# Patient Record
Sex: Male | Born: 1970 | Race: White | Hispanic: No | Marital: Married | Smoking: Former smoker
Health system: Southern US, Community
[De-identification: ages and names within clinical notes are randomized; demographics above are authoritative.]

---

## 2005-09-24 ENCOUNTER — Ambulatory Visit (HOSPITAL_COMMUNITY): Admission: RE | Admit: 2005-09-24 | Discharge: 2005-09-24 | Payer: Self-pay | Admitting: Orthopedic Surgery

## 2013-04-02 ENCOUNTER — Emergency Department (HOSPITAL_BASED_OUTPATIENT_CLINIC_OR_DEPARTMENT_OTHER)
Admission: EM | Admit: 2013-04-02 | Discharge: 2013-04-02 | Disposition: A | Discharge status: Home / Self Care | Payer: BC Managed Care – PPO | Attending: Emergency Medicine | Admitting: Emergency Medicine

## 2013-04-02 ENCOUNTER — Encounter (HOSPITAL_BASED_OUTPATIENT_CLINIC_OR_DEPARTMENT_OTHER): Payer: Self-pay | Admitting: Emergency Medicine

## 2013-04-02 ENCOUNTER — Emergency Department (HOSPITAL_BASED_OUTPATIENT_CLINIC_OR_DEPARTMENT_OTHER): Payer: BC Managed Care – PPO

## 2013-04-02 DIAGNOSIS — S99929A Unspecified injury of unspecified foot, initial encounter: Principal | ICD-10-CM

## 2013-04-02 DIAGNOSIS — W1809XA Striking against other object with subsequent fall, initial encounter: Secondary | ICD-10-CM | POA: Insufficient documentation

## 2013-04-02 DIAGNOSIS — Y9339 Activity, other involving climbing, rappelling and jumping off: Secondary | ICD-10-CM | POA: Insufficient documentation

## 2013-04-02 DIAGNOSIS — Y929 Unspecified place or not applicable: Secondary | ICD-10-CM | POA: Insufficient documentation

## 2013-04-02 DIAGNOSIS — S8991XA Unspecified injury of right lower leg, initial encounter: Secondary | ICD-10-CM

## 2013-04-02 DIAGNOSIS — S99919A Unspecified injury of unspecified ankle, initial encounter: Principal | ICD-10-CM

## 2013-04-02 DIAGNOSIS — Z79899 Other long term (current) drug therapy: Secondary | ICD-10-CM | POA: Insufficient documentation

## 2013-04-02 DIAGNOSIS — S8990XA Unspecified injury of unspecified lower leg, initial encounter: Secondary | ICD-10-CM | POA: Insufficient documentation

## 2013-04-02 NOTE — ED Notes (Signed)
MD at bedside. 

## 2013-04-02 NOTE — ED Provider Notes (Signed)
CSN: [id number]     Arrival date & time 04/02/13  2148 History  This chart was scribed for Richardean Canal, MD by Dorothey Baseman, ED Scribe. This patient was seen in room MH11/MH11 and the patient's care was started at 10:20 PM.    Chief Complaint  Patient presents with  . Leg Injury   The history is provided by the patient. No language interpreter was used.   HPI Comments: Matthew Calhoun is a 43 y.o. male who presents to the Emergency Department complaining of an injury to the right leg that he sustained earlier today after he reports that jumped onto a sled to land on his knees, but that the sled hit a bump, causing a lot of impact to the right knee. He denies hitting his head. Patient is complaining of a constant, sudden-onset pain to the right knee. Patient states that he has been ambulatory since the incident, but that the pain is exacerbated with walking/bearing weight. He reports some associated mild numbness and paresthesias to the area. He denies taking any medications at home to treat his symptoms. He denies history of prior injury or surgery to the knee. Patient has no other pertinent medical history.   History reviewed. No pertinent past medical history. History reviewed. No pertinent past surgical history. History reviewed. No pertinent family history. History  Substance Use Topics  . Smoking status: Never Smoker   . Smokeless tobacco: Not on file  . Alcohol Use: No    Review of Systems  Musculoskeletal: Positive for arthralgias.  Neurological: Positive for numbness.  All other systems reviewed and are negative.   Allergies  Review of patient's allergies indicates no known allergies.  Home Medications   Current Outpatient Rx  Name  Route  Sig  Dispense  Refill  . ALPRAZolam (XANAX) 0.25 MG tablet   Oral   Take 0.25 mg by mouth at bedtime as needed for anxiety.         Marland Kitchen ibuprofen (ADVIL,MOTRIN) 800 MG tablet   Oral   Take 800 mg by mouth every 8 (eight) hours as needed.        . loratadine (CLARITIN) 10 MG tablet   Oral   Take 10 mg by mouth daily.           Triage Vitals: BP 150/105  Pulse 70  Temp(Src) 98.3 F (36.8 C) (Oral)  Resp 18  Ht 5\' 10"  (1.778 m)  Wt 195 lb (88.451 kg)  BMI 27.98 kg/m2  SpO2 100%  Physical Exam  Nursing note and vitals reviewed. Constitutional: He is oriented to person, place, and time. He appears well-developed and well-nourished. No distress.  HENT:  Head: Normocephalic and atraumatic.  Eyes: Conjunctivae are normal.  Neck: Normal range of motion. Neck supple.  Pulmonary/Chest: Effort normal. No respiratory distress.  Abdominal: He exhibits no distension.  Musculoskeletal: Normal range of motion.       Right knee: He exhibits swelling. Tenderness found.  No midline spinal tenderness to palpation.   Full range of motion of the bilateral hips without pain. No tenderness to the right femur or tibia/fibula.   Right knee is swollen with tenderness to palpation over the medial aspect. ACL and PCL intact.   Neurological: He is alert and oriented to person, place, and time.  Skin: Skin is warm and dry.  Psychiatric: He has a normal mood and affect. His behavior is normal.    ED Course  Procedures (including critical care time)  DIAGNOSTIC STUDIES: Oxygen  Saturation is 100% on room air, normal by my interpretation.    COORDINATION OF CARE: 10:25 PM- Ordered an x-ray of the right knee. Will order ibuprofen to manage symptoms. Discussed treatment plan with patient at bedside and patient verbalized agreement.     Labs Review Labs Reviewed - No data to display  Imaging Review Dg Knee Complete 4 Views Right  04/02/2013   CLINICAL DATA:  Injury  EXAM: RIGHT KNEE - COMPLETE 4+ VIEW  COMPARISON:  None.  FINDINGS: There is no evidence of fracture, dislocation, or joint effusion. There is no evidence of arthropathy or other focal bone abnormality. Soft tissues are unremarkable.  IMPRESSION: Negative.    Electronically Signed   By: Rise MuBenjamin  McClintock M.D.   On: 04/02/2013 22:56    EKG Interpretation   None       MDM   Final diagnoses:  None   Matthew Calhoun is a 43 y.o. male here with R knee injury. He is bearing weight on it. Xray showed no obvious fracture. I doubt tibial plateau fracture. Will give crutches, ace wrap knee. Recommend ortho f/u if symptoms worsen.    I personally performed the services described in this documentation, which was scribed in my presence. The recorded information has been reviewed and is accurate.   Richardean Canalavid H Yao, MD 04/02/13 [phone]

## 2013-04-02 NOTE — Discharge Instructions (Signed)
Take motrin 800 mg every 6 hrs for pain.  Use crutches and ace wrap for comfort.   Follow up with Dr. Pearletha ForgeHudnall in a week if pain not improved.   Return to ER if you are unable to walk, severe pain.

## 2013-04-02 NOTE — ED Notes (Signed)
Pt jumped on sled sitting on his knees, hit a bump and when patient landed he landed mostly on his right knee, severe pain immediately and when ambulating

## 2013-04-02 NOTE — ED Notes (Signed)
Pt has ace wrap, knee sleeve, and crutches at home and prefers a knee immobilizer, MD Silverio LayYao informed.

## 2013-04-10 ENCOUNTER — Ambulatory Visit (INDEPENDENT_AMBULATORY_CARE_PROVIDER_SITE_OTHER): Payer: BC Managed Care – PPO | Admitting: Family Medicine

## 2013-04-10 ENCOUNTER — Encounter: Payer: Self-pay | Admitting: Family Medicine

## 2013-04-10 VITALS — BP 126/85 | HR 73 | Ht 70.0 in | Wt 199.0 lb

## 2013-04-10 DIAGNOSIS — S99929A Unspecified injury of unspecified foot, initial encounter: Secondary | ICD-10-CM

## 2013-04-10 DIAGNOSIS — S99919A Unspecified injury of unspecified ankle, initial encounter: Secondary | ICD-10-CM

## 2013-04-10 DIAGNOSIS — S8990XA Unspecified injury of unspecified lower leg, initial encounter: Secondary | ICD-10-CM

## 2013-04-10 DIAGNOSIS — S8991XA Unspecified injury of right lower leg, initial encounter: Secondary | ICD-10-CM

## 2013-04-10 NOTE — Patient Instructions (Signed)
You sprained your PCL. Ice knee 15 minutes at a time 3-4 times a day. Ibuprofen 600mg  three times a day or aleve 2 tabs twice a day as needed for pain, inflammation. Wear sleeve or ACE wrap for compression. Knee brace when up and walking around. Elevate above the level of your heart as much as possible. Straight leg raises, knee extensions, half-squats and half-lunges 3 sets of 10 once a day. Otherwise activities as tolerated. Follow up with me in 4 weeks for reevaluation.

## 2013-04-12 ENCOUNTER — Encounter: Payer: Self-pay | Admitting: Family Medicine

## 2013-04-12 NOTE — Progress Notes (Signed)
Patient ID: Matthew GladdenJason Comer, male   DOB: 1970/02/21, 43 y.o.   MRN: [id number]  PCP: Toniann FailHAWKS,AL N, MD  Subjective:   HPI: Patient is a 43 y.o. male here for right knee injury.  Patient reports on 2/26 he jumped onto a sled landing hard on his knees. Felt fairly severe pain in right knee and had to stop - allowed kids to continue sledding. By a couple hours later knee was swollen, went to ED. X-rays negative. Started with immobilizer and icing for a few days. Now using knee sleeve with brace. No prior injuries. A lot of improvement in pain and swelling since the injury. No catching, locking.  History reviewed. No pertinent past medical history.  Current Outpatient Prescriptions on File Prior to Visit  Medication Sig Dispense Refill  . ALPRAZolam (XANAX) 0.25 MG tablet Take 0.25 mg by mouth at bedtime as needed for anxiety.      Marland Kitchen. ibuprofen (ADVIL,MOTRIN) 800 MG tablet Take 800 mg by mouth every 8 (eight) hours as needed.      . loratadine (CLARITIN) 10 MG tablet Take 10 mg by mouth daily.       No current facility-administered medications on file prior to visit.    History reviewed. No pertinent past surgical history.  Allergies  Allergen Reactions  . Shellfish Allergy     History   Social History  . Marital Status: Married    Spouse Name: N/A    Number of Children: N/A  . Years of Education: N/A   Occupational History  . Not on file.   Social History Main Topics  . Smoking status: Former Smoker    Quit date: 02/06/2003  . Smokeless tobacco: Not on file  . Alcohol Use: No  . Drug Use: Not on file  . Sexual Activity: Not on file   Other Topics Concern  . Not on file   Social History Narrative  . No narrative on file    Family History  Problem Relation Age of Onset  . Heart attack Father   . Sudden death Father   . Diabetes Neg Hx   . Hyperlipidemia Neg Hx   . Hypertension Neg Hx     BP 126/85  Pulse 73  Ht 5\' 10"  (1.778 m)  Wt 199 lb (90.266 kg)  BMI  28.55 kg/m2  Review of Systems: See HPI above.    Objective:  Physical Exam:  Gen: NAD  R knee: Mod-large effusion.  No bruising, other deformity. No TTP joint lines, elsewhere about knee. ROM 0 - 110 degrees, limited by swelling. Negative ant/post drawers. Negative valgus/varus testing. Negative lachmanns. Negative mcmurrays, apleys, patellar apprehension. NV intact distally.    Assessment & Plan:  1. Right knee injury - given patient's mechanism of injury (direct blow) along with effusion and otherwise normal exam suspect he has a partial tear/sprain of his PCL.  Already improving quite a bit.  Will treat conservatively with icing, nsaids, knee brace, home exercise program.  Activities as tolerated.  F/u in 4 weeks for reevaluation.  Consider MRi if not improving.

## 2013-04-13 DIAGNOSIS — S8991XA Unspecified injury of right lower leg, initial encounter: Secondary | ICD-10-CM | POA: Insufficient documentation

## 2013-04-13 NOTE — Assessment & Plan Note (Signed)
given patient's mechanism of injury (direct blow) along with effusion and otherwise normal exam suspect he has a partial tear/sprain of his PCL.  Already improving quite a bit.  Will treat conservatively with icing, nsaids, knee brace, home exercise program.  Activities as tolerated.  F/u in 4 weeks for reevaluation.  Consider MRi if not improving.

## 2013-05-14 ENCOUNTER — Ambulatory Visit (INDEPENDENT_AMBULATORY_CARE_PROVIDER_SITE_OTHER): Payer: BC Managed Care – PPO | Admitting: Family Medicine

## 2013-05-14 ENCOUNTER — Encounter: Payer: Self-pay | Admitting: Family Medicine

## 2013-05-14 VITALS — BP 144/95 | HR 73 | Ht 70.0 in | Wt 190.0 lb

## 2013-05-14 DIAGNOSIS — S8990XA Unspecified injury of unspecified lower leg, initial encounter: Secondary | ICD-10-CM

## 2013-05-14 DIAGNOSIS — S99929A Unspecified injury of unspecified foot, initial encounter: Secondary | ICD-10-CM

## 2013-05-14 DIAGNOSIS — S8991XA Unspecified injury of right lower leg, initial encounter: Secondary | ICD-10-CM

## 2013-05-14 DIAGNOSIS — S99919A Unspecified injury of unspecified ankle, initial encounter: Secondary | ICD-10-CM

## 2013-05-14 NOTE — Patient Instructions (Signed)
You sprained your PCL. Ice knee 15 minutes at a time 3-4 times a day as needed. Ibuprofen 600mg  three times a day or aleve 2 tabs twice a day as needed for pain, inflammation. Wear sleeve or ACE wrap for compression. Elevate above the level of your heart as much as possible for swelling. Start physical therapy and do home exercises every day you don't go to therapy. Otherwise activities as tolerated. Follow up with me in 6 weeks. If you don't continue to improve would consider MRI.

## 2013-05-18 ENCOUNTER — Encounter: Payer: Self-pay | Admitting: Family Medicine

## 2013-05-18 NOTE — Progress Notes (Signed)
Patient ID: Matthew GladdenJason Calhoun, male   DOB: 07/04/1970, 43 y.o.   MRN: [id number]  PCP: Toniann FailHAWKS,AL N, MD  Subjective:   HPI: Patient is a 43 y.o. male here for right knee injury.  3/6: Patient reports on 2/26 he jumped onto a sled landing hard on his knees. Felt fairly severe pain in right knee and had to stop - allowed kids to continue sledding. By a couple hours later knee was swollen, went to ED. X-rays negative. Started with immobilizer and icing for a few days. Now using knee sleeve with brace. No prior injuries. A lot of improvement in pain and swelling since the injury. No catching, locking.  4/9: Patient reports he's had a lot of improvement since last visit and initial injury. Now about 2/10 level of pain. Still swollen. Difficulty running and climbing stairs. Wearing sleeve. Some use of motrin and icing. No locking, catching, giving out.  History reviewed. No pertinent past medical history.  Current Outpatient Prescriptions on File Prior to Visit  Medication Sig Dispense Refill  . ALPRAZolam (XANAX) 0.25 MG tablet Take 0.25 mg by mouth at bedtime as needed for anxiety.      Marland Kitchen. ibuprofen (ADVIL,MOTRIN) 800 MG tablet Take 800 mg by mouth every 8 (eight) hours as needed.      . loratadine (CLARITIN) 10 MG tablet Take 10 mg by mouth daily.       No current facility-administered medications on file prior to visit.    History reviewed. No pertinent past surgical history.  Allergies  Allergen Reactions  . Shellfish Allergy     History   Social History  . Marital Status: Married    Spouse Name: N/A    Number of Children: N/A  . Years of Education: N/A   Occupational History  . Not on file.   Social History Main Topics  . Smoking status: Former Smoker    Quit date: 02/06/2003  . Smokeless tobacco: Not on file  . Alcohol Use: No  . Drug Use: Not on file  . Sexual Activity: Not on file   Other Topics Concern  . Not on file   Social History Narrative  . No  narrative on file    Family History  Problem Relation Age of Onset  . Heart attack Father   . Sudden death Father   . Diabetes Neg Hx   . Hyperlipidemia Neg Hx   . Hypertension Neg Hx     BP 144/95  Pulse 73  Ht 5\' 10"  (1.778 m)  Wt 190 lb (86.183 kg)  BMI 27.26 kg/m2  Review of Systems: See HPI above.    Objective:  Physical Exam:  Gen: NAD  R knee: Mod effusion.  No bruising, other deformity. No TTP joint lines, elsewhere about knee. ROM 0 - 110 degrees, limited by swelling. Negative ant/post drawers. Negative valgus/varus testing. Negative lachmanns. Negative mcmurrays, apleys, patellar apprehension. NV intact distally.    Assessment & Plan:  1. Right knee injury - given patient's mechanism of injury (direct blow) along with effusion and otherwise normal exam suspect he has a partial tear/sprain of his PCL.  No instability, locking, catching.  Primary issue is the swelling I believe at this point.  Will try to treat conservatively without mechanical symptoms, instability.  Start physical therapy.  Icing, nsaids, sleeve, home exercise program.  Activities as tolerated.  F/u in 6 weeks for reevaluation.  Consider MRi if not improving.

## 2013-05-18 NOTE — Assessment & Plan Note (Signed)
given patient's mechanism of injury (direct blow) along with effusion and otherwise normal exam suspect he has a partial tear/sprain of his PCL.  No instability, locking, catching.  Primary issue is the swelling I believe at this point.  Will try to treat conservatively without mechanical symptoms, instability.  Start physical therapy.  Icing, nsaids, sleeve, home exercise program.  Activities as tolerated.  F/u in 6 weeks for reevaluation.  Consider MRi if not improving.

## 2013-06-25 ENCOUNTER — Telehealth: Payer: Self-pay | Admitting: Family Medicine

## 2013-06-25 ENCOUNTER — Ambulatory Visit: Payer: BC Managed Care – PPO | Admitting: Family Medicine

## 2013-06-25 NOTE — Telephone Encounter (Signed)
We will arrange.  Thanks!

## 2013-07-14 ENCOUNTER — Other Ambulatory Visit: Payer: Self-pay | Admitting: *Deleted

## 2013-07-14 DIAGNOSIS — M25561 Pain in right knee: Secondary | ICD-10-CM

## 2013-07-22 ENCOUNTER — Ambulatory Visit: Payer: BC Managed Care – PPO | Attending: Family Medicine | Admitting: Physical Therapy

## 2013-07-22 DIAGNOSIS — M25569 Pain in unspecified knee: Secondary | ICD-10-CM | POA: Diagnosis not present

## 2013-07-22 DIAGNOSIS — IMO0001 Reserved for inherently not codable concepts without codable children: Secondary | ICD-10-CM | POA: Insufficient documentation

## 2013-07-22 DIAGNOSIS — R262 Difficulty in walking, not elsewhere classified: Secondary | ICD-10-CM | POA: Insufficient documentation

## 2013-07-22 DIAGNOSIS — R609 Edema, unspecified: Secondary | ICD-10-CM | POA: Insufficient documentation

## 2013-07-29 ENCOUNTER — Ambulatory Visit: Payer: BC Managed Care – PPO | Admitting: Rehabilitation

## 2013-07-29 DIAGNOSIS — IMO0001 Reserved for inherently not codable concepts without codable children: Secondary | ICD-10-CM | POA: Diagnosis not present

## 2013-08-05 ENCOUNTER — Ambulatory Visit: Payer: BC Managed Care – PPO | Admitting: Rehabilitation

## 2013-08-10 ENCOUNTER — Ambulatory Visit: Payer: BC Managed Care – PPO | Attending: Family Medicine | Admitting: Rehabilitation

## 2013-08-10 DIAGNOSIS — R262 Difficulty in walking, not elsewhere classified: Secondary | ICD-10-CM | POA: Diagnosis not present

## 2013-08-10 DIAGNOSIS — R609 Edema, unspecified: Secondary | ICD-10-CM | POA: Diagnosis not present

## 2013-08-10 DIAGNOSIS — M25569 Pain in unspecified knee: Secondary | ICD-10-CM | POA: Diagnosis not present

## 2013-08-10 DIAGNOSIS — IMO0001 Reserved for inherently not codable concepts without codable children: Secondary | ICD-10-CM | POA: Insufficient documentation

## 2013-08-13 ENCOUNTER — Ambulatory Visit: Payer: BC Managed Care – PPO | Admitting: Rehabilitation

## 2013-08-13 DIAGNOSIS — IMO0001 Reserved for inherently not codable concepts without codable children: Secondary | ICD-10-CM | POA: Diagnosis not present

## 2013-08-17 ENCOUNTER — Ambulatory Visit: Payer: BC Managed Care – PPO | Admitting: Rehabilitation

## 2013-08-17 DIAGNOSIS — IMO0001 Reserved for inherently not codable concepts without codable children: Secondary | ICD-10-CM | POA: Diagnosis not present

## 2013-08-20 ENCOUNTER — Ambulatory Visit: Payer: BC Managed Care – PPO | Admitting: Physical Therapy

## 2013-08-20 DIAGNOSIS — IMO0001 Reserved for inherently not codable concepts without codable children: Secondary | ICD-10-CM | POA: Diagnosis not present

## 2013-08-24 ENCOUNTER — Ambulatory Visit: Payer: BC Managed Care – PPO | Admitting: Rehabilitation

## 2013-08-24 DIAGNOSIS — IMO0001 Reserved for inherently not codable concepts without codable children: Secondary | ICD-10-CM | POA: Diagnosis not present

## 2013-08-27 ENCOUNTER — Ambulatory Visit: Payer: BC Managed Care – PPO | Admitting: Physical Therapy

## 2013-08-27 DIAGNOSIS — IMO0001 Reserved for inherently not codable concepts without codable children: Secondary | ICD-10-CM | POA: Diagnosis not present

## 2013-08-31 ENCOUNTER — Ambulatory Visit: Payer: BC Managed Care – PPO | Admitting: Rehabilitation

## 2013-08-31 DIAGNOSIS — IMO0001 Reserved for inherently not codable concepts without codable children: Secondary | ICD-10-CM | POA: Diagnosis not present

## 2013-09-03 ENCOUNTER — Ambulatory Visit: Payer: BC Managed Care – PPO | Admitting: Physical Therapy

## 2013-09-07 ENCOUNTER — Ambulatory Visit: Payer: BC Managed Care – PPO | Attending: Family Medicine | Admitting: Physical Therapy

## 2013-09-07 DIAGNOSIS — IMO0001 Reserved for inherently not codable concepts without codable children: Secondary | ICD-10-CM | POA: Diagnosis present

## 2013-09-07 DIAGNOSIS — R262 Difficulty in walking, not elsewhere classified: Secondary | ICD-10-CM | POA: Insufficient documentation

## 2013-09-07 DIAGNOSIS — M25569 Pain in unspecified knee: Secondary | ICD-10-CM | POA: Insufficient documentation

## 2013-09-07 DIAGNOSIS — R609 Edema, unspecified: Secondary | ICD-10-CM | POA: Diagnosis not present

## 2013-09-09 ENCOUNTER — Ambulatory Visit: Payer: BC Managed Care – PPO | Admitting: Rehabilitation

## 2013-09-09 DIAGNOSIS — IMO0001 Reserved for inherently not codable concepts without codable children: Secondary | ICD-10-CM | POA: Diagnosis not present

## 2013-09-14 ENCOUNTER — Encounter: Payer: Self-pay | Admitting: Family Medicine

## 2013-09-14 ENCOUNTER — Ambulatory Visit (INDEPENDENT_AMBULATORY_CARE_PROVIDER_SITE_OTHER): Payer: BC Managed Care – PPO | Admitting: Family Medicine

## 2013-09-14 ENCOUNTER — Ambulatory Visit: Payer: BC Managed Care – PPO | Admitting: Rehabilitation

## 2013-09-14 VITALS — BP 140/100 | HR 74 | Ht 70.0 in | Wt 200.0 lb

## 2013-09-14 DIAGNOSIS — S8990XA Unspecified injury of unspecified lower leg, initial encounter: Secondary | ICD-10-CM

## 2013-09-14 DIAGNOSIS — IMO0001 Reserved for inherently not codable concepts without codable children: Secondary | ICD-10-CM | POA: Diagnosis not present

## 2013-09-14 DIAGNOSIS — S99919A Unspecified injury of unspecified ankle, initial encounter: Secondary | ICD-10-CM

## 2013-09-14 DIAGNOSIS — S8991XA Unspecified injury of right lower leg, initial encounter: Secondary | ICD-10-CM

## 2013-09-14 DIAGNOSIS — S99929A Unspecified injury of unspecified foot, initial encounter: Secondary | ICD-10-CM

## 2013-09-14 NOTE — Patient Instructions (Signed)
I will call you the business day following your MRI to go over results. Continue physical therapy in the meantime.

## 2013-09-15 ENCOUNTER — Other Ambulatory Visit: Payer: Self-pay | Admitting: Family Medicine

## 2013-09-15 DIAGNOSIS — Z1389 Encounter for screening for other disorder: Secondary | ICD-10-CM

## 2013-09-16 ENCOUNTER — Ambulatory Visit: Payer: BC Managed Care – PPO | Admitting: Physical Therapy

## 2013-09-16 ENCOUNTER — Ambulatory Visit (HOSPITAL_BASED_OUTPATIENT_CLINIC_OR_DEPARTMENT_OTHER)
Admission: RE | Admit: 2013-09-16 | Discharge: 2013-09-16 | Disposition: A | Discharge status: Home / Self Care | Payer: BC Managed Care – PPO | Source: Ambulatory Visit | Attending: Family Medicine | Admitting: Family Medicine

## 2013-09-16 ENCOUNTER — Encounter: Payer: Self-pay | Admitting: Family Medicine

## 2013-09-16 DIAGNOSIS — X58XXXA Exposure to other specified factors, initial encounter: Secondary | ICD-10-CM | POA: Diagnosis not present

## 2013-09-16 DIAGNOSIS — S8991XA Unspecified injury of right lower leg, initial encounter: Secondary | ICD-10-CM

## 2013-09-16 DIAGNOSIS — S8990XA Unspecified injury of unspecified lower leg, initial encounter: Secondary | ICD-10-CM | POA: Insufficient documentation

## 2013-09-16 DIAGNOSIS — IMO0001 Reserved for inherently not codable concepts without codable children: Secondary | ICD-10-CM | POA: Diagnosis not present

## 2013-09-16 DIAGNOSIS — R609 Edema, unspecified: Secondary | ICD-10-CM | POA: Insufficient documentation

## 2013-09-16 DIAGNOSIS — S99919A Unspecified injury of unspecified ankle, initial encounter: Secondary | ICD-10-CM | POA: Diagnosis present

## 2013-09-16 DIAGNOSIS — S99929A Unspecified injury of unspecified foot, initial encounter: Principal | ICD-10-CM

## 2013-09-16 DIAGNOSIS — Z1389 Encounter for screening for other disorder: Secondary | ICD-10-CM

## 2013-09-16 NOTE — Progress Notes (Addendum)
Patient ID: Matthew GladdenJason Calhoun, male   DOB: [birth year], 43 y.o.   MRN: [id number]  PCP: Matthew FailHAWKS,AL N, MD  Subjective:   HPI: Patient is a 43 y.o. male here for right knee injury.  3/6: Patient reports on 2/26 he jumped onto a sled landing hard on his knees. Felt fairly severe pain in right knee and had to stop - allowed kids to continue sledding. By a couple hours later knee was swollen, went to ED. X-rays negative. Started with immobilizer and icing for a few days. Now using knee sleeve with brace. No prior injuries. A lot of improvement in pain and swelling since the injury. No catching, locking.  4/9: Patient reports he's had a lot of improvement since last visit and initial injury. Now about 2/10 level of pain. Still swollen. Difficulty running and climbing stairs. Wearing sleeve. Some use of motrin and icing. No locking, catching, giving out.  8/10: Patient reports he's been doing physical therapy and has improved some but feels like this has plateaued. Difficulty with stairs, cycling. Worse when kneeling also. No catching, locking, giving out.  History reviewed. No pertinent past medical history.  Current Outpatient Prescriptions on File Prior to Visit  Medication Sig Dispense Refill  . ALPRAZolam (XANAX) 0.25 MG tablet Take 0.25 mg by mouth at bedtime as needed for anxiety.      Marland Kitchen. ibuprofen (ADVIL,MOTRIN) 800 MG tablet Take 800 mg by mouth every 8 (eight) hours as needed.      . loratadine (CLARITIN) 10 MG tablet Take 10 mg by mouth daily.       No current facility-administered medications on file prior to visit.    History reviewed. No pertinent past surgical history.  Allergies  Allergen Reactions  . Shellfish Allergy     History   Social History  . Marital Status: Married    Spouse Name: Calhoun/A    Number of Children: Calhoun/A  . Years of Education: Calhoun/A   Occupational History  . Not on file.   Social History Main Topics  . Smoking status: Former Smoker    Quit  date: 02/06/2003  . Smokeless tobacco: Not on file  . Alcohol Use: No  . Drug Use: Not on file  . Sexual Activity: Not on file   Other Topics Concern  . Not on file   Social History Narrative  . No narrative on file    Family History  Problem Relation Age of Onset  . Heart attack Father   . Sudden death Father   . Diabetes Neg Hx   . Hyperlipidemia Neg Hx   . Hypertension Neg Hx     BP 140/100  Pulse 74  Ht 5\' 10"  (1.778 m)  Wt 200 lb (90.719 kg)  BMI 28.70 kg/m2  Review of Systems: See HPI above.    Objective:  Physical Exam:  Gen: NAD  R knee: Mild effusion.  No bruising, other deformity. No TTP joint lines, elsewhere about knee. ROM 0 - 120 degrees, limited by swelling. Negative ant/post drawers. Negative valgus/varus testing. Negative lachmanns. Negative mcmurrays, apleys, patellar apprehension. NV intact distally.    Assessment & Plan:  1. Right knee injury - given patient's mechanism of injury (direct blow) along with effusion and otherwise normal exam suspect he has a partial tear/sprain of his PCL.  Improvement has plateaued with physical therapy.  Will move forward with MRI to further assess.  Addendum:  MRI reviewed and discussed with patient.  He does have a partial avulsion of  the vastus lateralis though we discussed treatment for this would be physical therapy as he is doing (can consider nitro patches as a trial as well).  Also has some chondral thinning and a small posterolateral femoral condyle OCD (7mm).  This is quite small and not on a part of the condyle that gets much weight bearing.  Advised him to continue with physical therapy.  Would consider intraarticular injection if not improving.  If still not improving he could consult with surgery though am not certain microfracture of a lesion this size would provide him much benefit.

## 2013-09-16 NOTE — Assessment & Plan Note (Signed)
given patient's mechanism of injury (direct blow) along with effusion and otherwise normal exam suspect he has a partial tear/sprain of his PCL.  Improvement has plateaued with physical therapy.  Will move forward with MRI to further assess.

## 2013-09-21 ENCOUNTER — Ambulatory Visit: Payer: BC Managed Care – PPO | Admitting: Rehabilitation

## 2013-09-23 ENCOUNTER — Ambulatory Visit: Payer: BC Managed Care – PPO | Admitting: Physical Therapy

## 2013-09-23 DIAGNOSIS — IMO0001 Reserved for inherently not codable concepts without codable children: Secondary | ICD-10-CM | POA: Diagnosis not present

## 2013-09-28 ENCOUNTER — Ambulatory Visit: Payer: BC Managed Care – PPO | Admitting: Rehabilitation

## 2013-09-30 ENCOUNTER — Ambulatory Visit: Payer: BC Managed Care – PPO | Admitting: Physical Therapy

## 2013-10-05 ENCOUNTER — Ambulatory Visit: Payer: BC Managed Care – PPO | Admitting: Rehabilitation

## 2013-10-07 ENCOUNTER — Ambulatory Visit: Payer: BC Managed Care – PPO | Admitting: Physical Therapy

## 2013-10-21 ENCOUNTER — Ambulatory Visit: Payer: BC Managed Care – PPO | Attending: Family Medicine | Admitting: Physical Therapy

## 2013-10-21 DIAGNOSIS — M25569 Pain in unspecified knee: Secondary | ICD-10-CM | POA: Diagnosis not present

## 2013-10-21 DIAGNOSIS — R262 Difficulty in walking, not elsewhere classified: Secondary | ICD-10-CM | POA: Diagnosis not present

## 2013-10-21 DIAGNOSIS — R609 Edema, unspecified: Secondary | ICD-10-CM | POA: Insufficient documentation

## 2013-10-21 DIAGNOSIS — IMO0001 Reserved for inherently not codable concepts without codable children: Secondary | ICD-10-CM | POA: Insufficient documentation

## 2013-10-26 ENCOUNTER — Ambulatory Visit: Payer: BC Managed Care – PPO | Admitting: Physical Therapy

## 2013-10-26 DIAGNOSIS — IMO0001 Reserved for inherently not codable concepts without codable children: Secondary | ICD-10-CM | POA: Diagnosis not present

## 2013-10-29 ENCOUNTER — Ambulatory Visit: Payer: BC Managed Care – PPO | Admitting: Physical Therapy

## 2013-10-29 DIAGNOSIS — IMO0001 Reserved for inherently not codable concepts without codable children: Secondary | ICD-10-CM | POA: Diagnosis not present

## 2013-11-02 ENCOUNTER — Ambulatory Visit: Payer: BC Managed Care – PPO | Admitting: Rehabilitation

## 2013-11-05 ENCOUNTER — Ambulatory Visit: Payer: BC Managed Care – PPO | Admitting: Physical Therapy

## 2013-11-17 ENCOUNTER — Ambulatory Visit: Payer: BC Managed Care – PPO | Admitting: Physical Therapy

## 2013-11-20 ENCOUNTER — Other Ambulatory Visit: Payer: Self-pay

## 2013-11-23 ENCOUNTER — Ambulatory Visit: Payer: BC Managed Care – PPO | Admitting: Physical Therapy

## 2013-11-25 ENCOUNTER — Ambulatory Visit: Payer: BC Managed Care – PPO | Admitting: Rehabilitation

## 2013-11-30 ENCOUNTER — Ambulatory Visit: Payer: BC Managed Care – PPO | Admitting: Physical Therapy

## 2013-12-02 ENCOUNTER — Ambulatory Visit: Payer: BC Managed Care – PPO | Admitting: Physical Therapy

## 2013-12-07 ENCOUNTER — Ambulatory Visit: Payer: BC Managed Care – PPO | Admitting: Physical Therapy

## 2013-12-09 ENCOUNTER — Ambulatory Visit: Payer: BC Managed Care – PPO | Admitting: Rehabilitation

## 2015-09-29 IMAGING — CR DG KNEE COMPLETE 4+V*R*
4 series · 4 of 4 positions shown · non-contrast
Comparison: None.

CLINICAL DATA: Injury

EXAM:
RIGHT KNEE - COMPLETE 4+ VIEW

[t knee ap right]
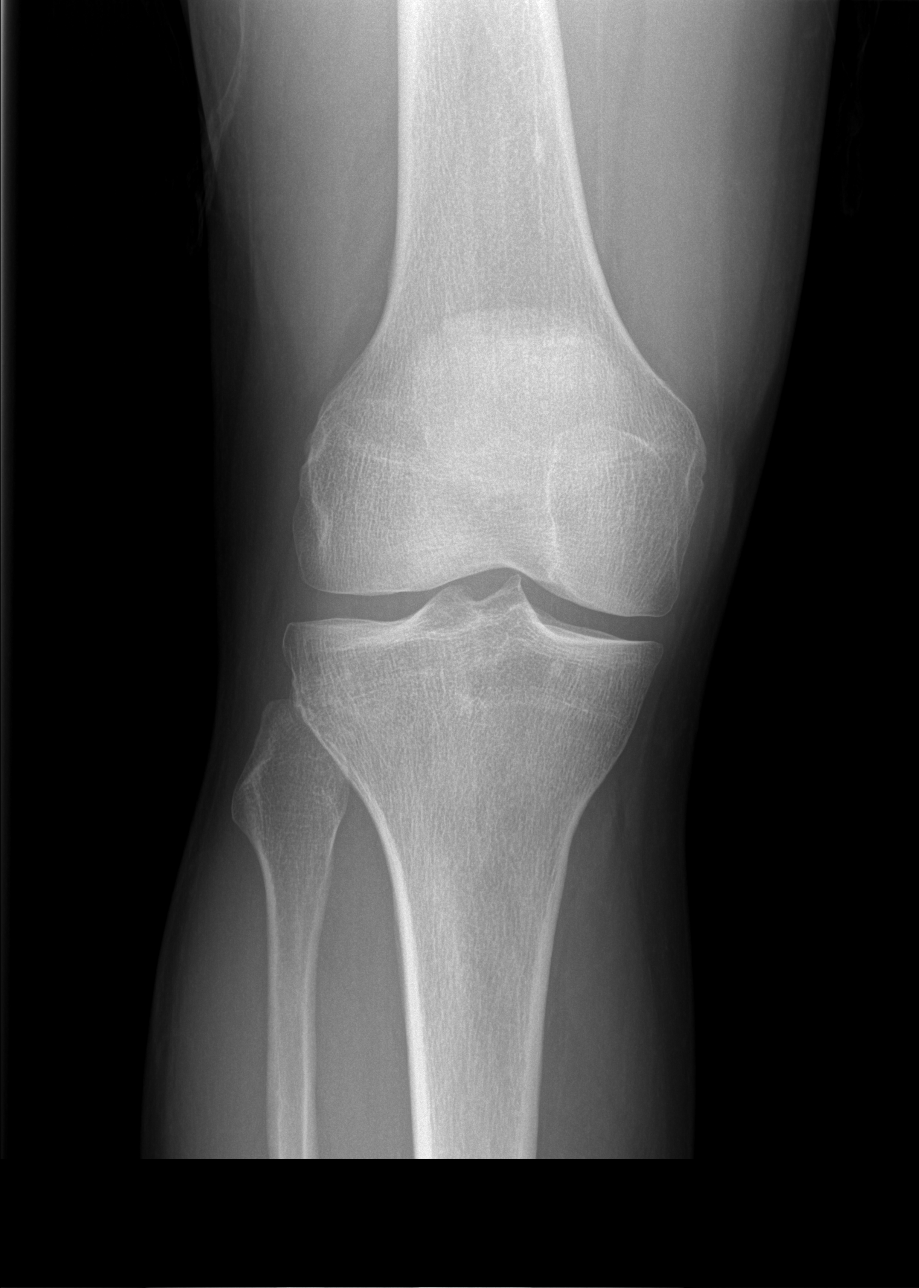

[t knee oblique right (1 of 2)]
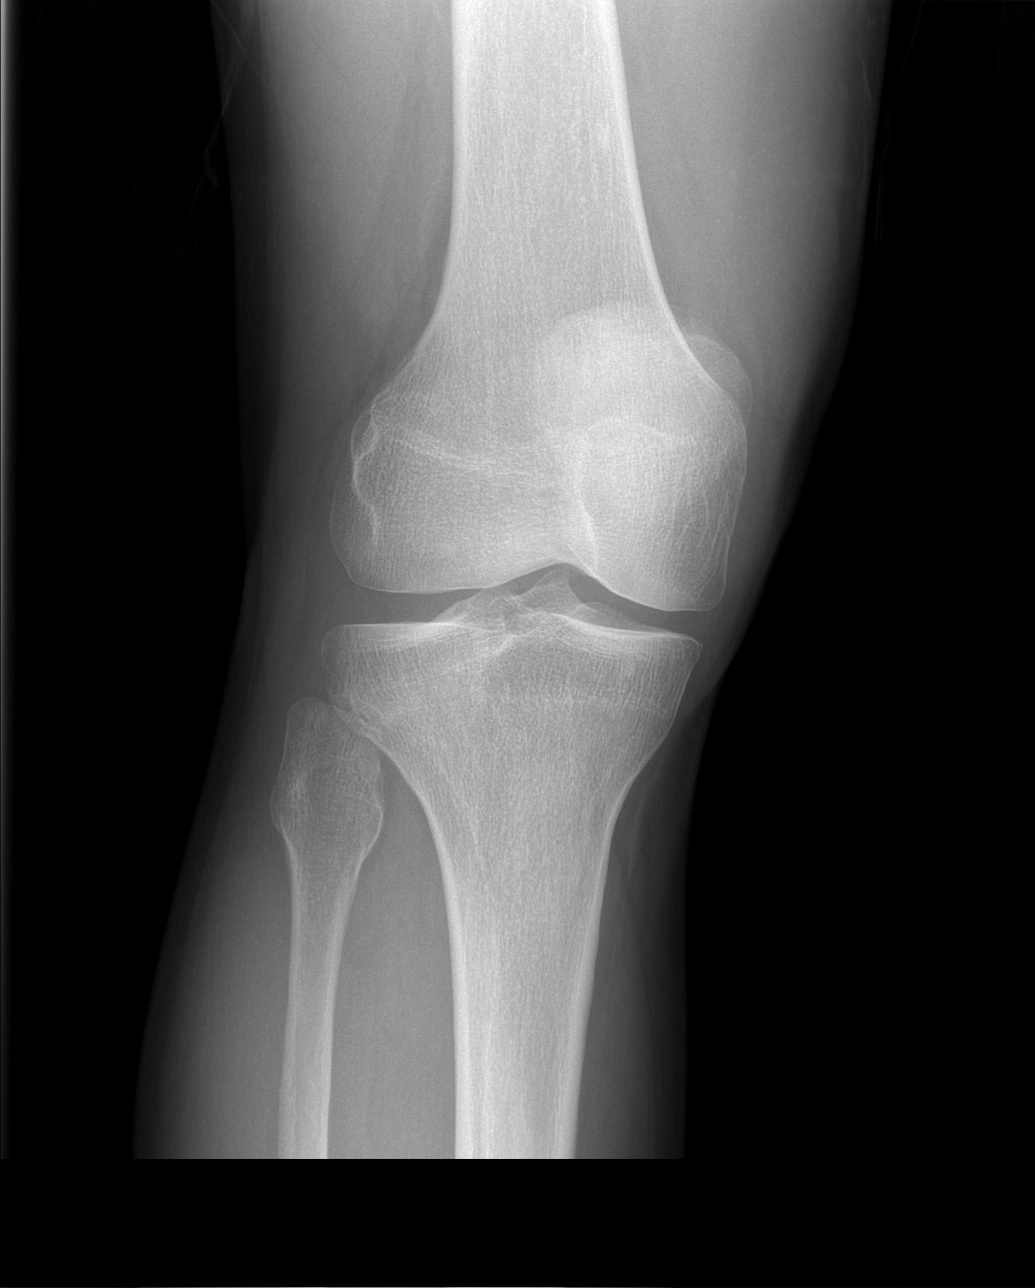

[t knee oblique right (2 of 2)]
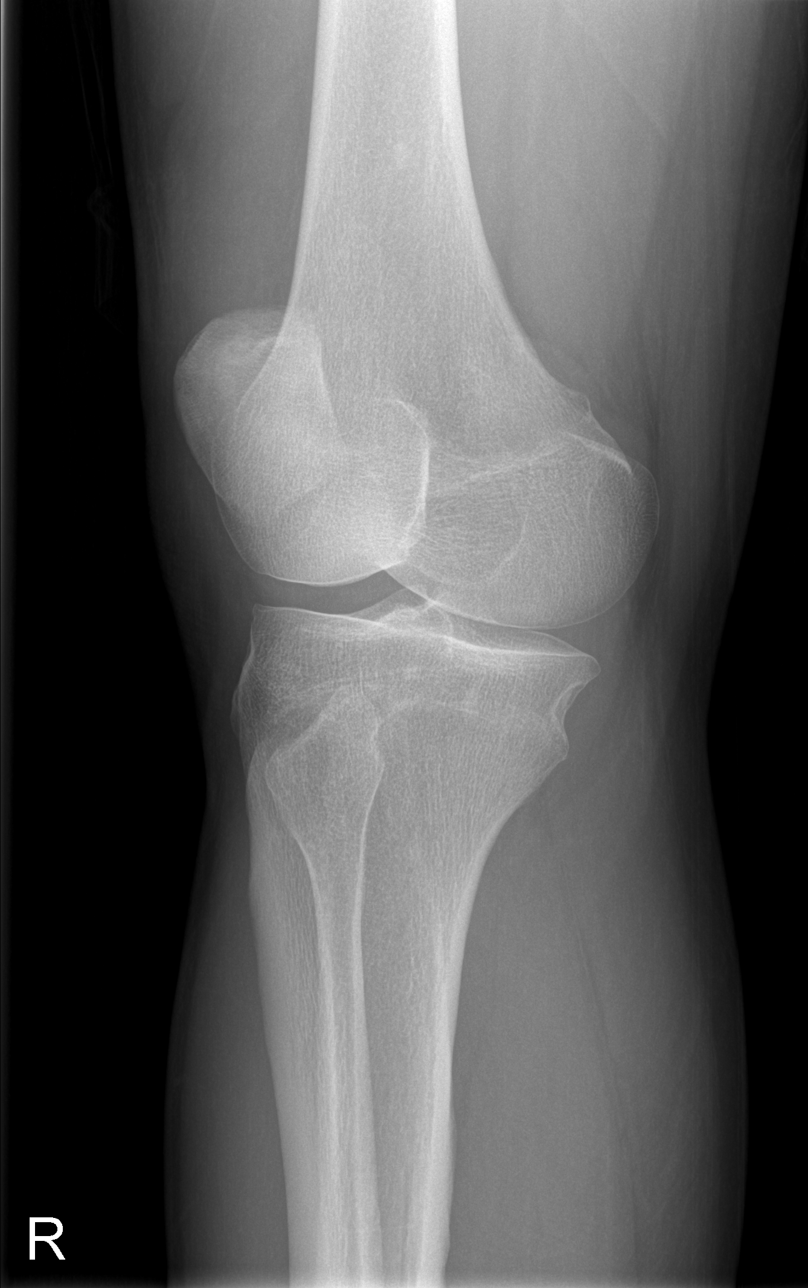

[t knee lat right]
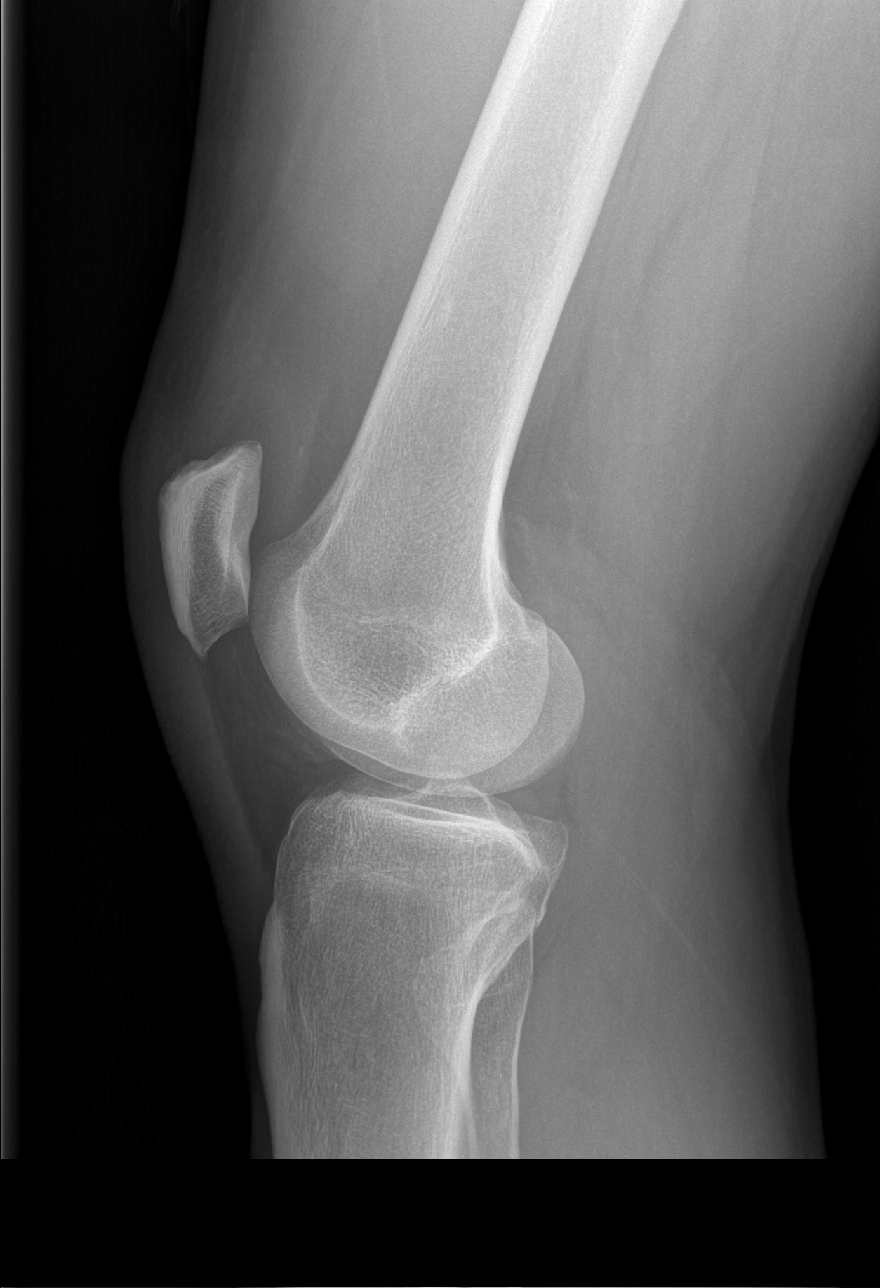

[4 of 4 positions shown; findings below may reference images not displayed]

FINDINGS: There is no evidence of fracture, dislocation, or joint effusion.
There is no evidence of arthropathy or other focal bone abnormality.
Soft tissues are unremarkable.
IMPRESSION: Negative.

## 2016-12-22 ENCOUNTER — Emergency Department (HOSPITAL_COMMUNITY)
Admission: EM | Admit: 2016-12-22 | Discharge: 2016-12-22 | Disposition: A | Discharge status: Home / Self Care | Payer: BLUE CROSS/BLUE SHIELD | Attending: Emergency Medicine | Admitting: Emergency Medicine

## 2016-12-22 ENCOUNTER — Emergency Department (HOSPITAL_BASED_OUTPATIENT_CLINIC_OR_DEPARTMENT_OTHER)
Admission: EM | Admit: 2016-12-22 | Discharge: 2016-12-22 | Disposition: A | Discharge status: Home / Self Care | Payer: BLUE CROSS/BLUE SHIELD | Source: Home / Self Care

## 2016-12-22 ENCOUNTER — Other Ambulatory Visit: Payer: Self-pay

## 2016-12-22 ENCOUNTER — Encounter (HOSPITAL_COMMUNITY): Payer: Self-pay

## 2016-12-22 ENCOUNTER — Encounter (HOSPITAL_BASED_OUTPATIENT_CLINIC_OR_DEPARTMENT_OTHER): Payer: Self-pay | Admitting: Emergency Medicine

## 2016-12-22 ENCOUNTER — Emergency Department (HOSPITAL_COMMUNITY): Payer: BLUE CROSS/BLUE SHIELD

## 2016-12-22 DIAGNOSIS — Z91013 Allergy to seafood: Secondary | ICD-10-CM | POA: Diagnosis not present

## 2016-12-22 DIAGNOSIS — Z79899 Other long term (current) drug therapy: Secondary | ICD-10-CM | POA: Insufficient documentation

## 2016-12-22 DIAGNOSIS — R1013 Epigastric pain: Secondary | ICD-10-CM | POA: Diagnosis present

## 2016-12-22 DIAGNOSIS — I10 Essential (primary) hypertension: Secondary | ICD-10-CM

## 2016-12-22 DIAGNOSIS — I160 Hypertensive urgency: Secondary | ICD-10-CM | POA: Diagnosis not present

## 2016-12-22 DIAGNOSIS — Z5321 Procedure and treatment not carried out due to patient leaving prior to being seen by health care provider: Secondary | ICD-10-CM | POA: Insufficient documentation

## 2016-12-22 DIAGNOSIS — R51 Headache: Secondary | ICD-10-CM

## 2016-12-22 DIAGNOSIS — R109 Unspecified abdominal pain: Secondary | ICD-10-CM

## 2016-12-22 DIAGNOSIS — R10812 Left upper quadrant abdominal tenderness: Secondary | ICD-10-CM | POA: Insufficient documentation

## 2016-12-22 DIAGNOSIS — Z87891 Personal history of nicotine dependence: Secondary | ICD-10-CM | POA: Diagnosis not present

## 2016-12-22 DIAGNOSIS — R1012 Left upper quadrant pain: Secondary | ICD-10-CM | POA: Diagnosis not present

## 2016-12-22 LAB — BASIC METABOLIC PANEL
Anion gap: 9 (ref 5–15)
BUN: 11 mg/dL (ref 6–20)
CHLORIDE: 101 mmol/L (ref 101–111)
CO2: 27 mmol/L (ref 22–32)
CREATININE: 0.81 mg/dL (ref 0.61–1.24)
Calcium: 9.4 mg/dL (ref 8.9–10.3)
GFR calc non Af Amer: >60 mL/min (ref >60)
Glucose, Bld: 96 mg/dL (ref 65–99)
POTASSIUM: 3.6 mmol/L (ref 3.5–5.1)
Sodium: 137 mmol/L (ref 135–145)

## 2016-12-22 LAB — URINALYSIS, ROUTINE W REFLEX MICROSCOPIC
Bilirubin Urine: NEGATIVE
GLUCOSE, UA: NEGATIVE mg/dL
Hgb urine dipstick: NEGATIVE
Ketones, ur: NEGATIVE mg/dL
LEUKOCYTES UA: NEGATIVE
Nitrite: NEGATIVE
PH: 7 (ref 5.0–8.0)
PROTEIN: NEGATIVE mg/dL
Specific Gravity, Urine: 1.004 — ABNORMAL LOW (ref 1.005–1.030)

## 2016-12-22 LAB — CBC
HEMATOCRIT: 51.7 % (ref 39.0–52.0)
HEMOGLOBIN: 17.8 g/dL — AB (ref 13.0–17.0)
MCH: 29.6 pg (ref 26.0–34.0)
MCHC: 34.4 g/dL (ref 30.0–36.0)
MCV: 86 fL (ref 78.0–100.0)
Platelets: 247 10*3/uL (ref 150–400)
RBC: 6.01 MIL/uL — ABNORMAL HIGH (ref 4.22–5.81)
RDW: 13.2 % (ref 11.5–15.5)
WBC: 10.5 10*3/uL (ref 4.0–10.5)

## 2016-12-22 LAB — I-STAT TROPONIN, ED
TROPONIN I, POC: 0 ng/mL (ref 0.00–0.08)
Troponin i, poc: 0.01 ng/mL (ref 0.00–0.08)

## 2016-12-22 LAB — LIPASE, BLOOD: LIPASE: 42 U/L (ref 11–51)

## 2016-12-22 MED ORDER — GI COCKTAIL ~~LOC~~
30.0000 mL | Freq: Once | ORAL | Status: AC
  Administered 2016-12-22: 30 mL via ORAL
  Filled 2016-12-22: qty 30

## 2016-12-22 MED ORDER — HYDROCHLOROTHIAZIDE 25 MG PO TABS
25.0000 mg | ORAL_TABLET | Freq: Every day | ORAL | Status: DC
  Administered 2016-12-22: 25 mg via ORAL
  Filled 2016-12-22: qty 1

## 2016-12-22 MED ORDER — HYDROCHLOROTHIAZIDE 25 MG PO TABS: 25.0000 mg | ORAL_TABLET | Freq: Every day | ORAL | 0 refills | Status: AC

## 2016-12-22 NOTE — ED Notes (Signed)
Pt asked if we had a cardiologist on site. I educated pt and family on how the cardiologist are consulted if needed. [facility] states will feel better going to a real ER. Pt and family educated on how we have a EDP here and we treat all emergencies. Pt in no distress, ambulatory and speaking in complete sentences

## 2016-12-22 NOTE — ED Notes (Signed)
ED Provider at bedside. 

## 2016-12-22 NOTE — ED Triage Notes (Signed)
Pt states that he was at a football game began to have abd pain that radiates up into the R side of his chest, denies other cardiac symptoms.

## 2016-12-22 NOTE — ED Provider Notes (Signed)
[facility] Provider Note   CSN: [id number] Arrival date & time: 12/22/16  0212     History   Chief Complaint Chief Complaint  Patient presents with  . Abdominal Pain  . Chest Pain    HPI Claris GladdenJason Chipps is a 46 y.o. male.  HPI  Patient presents with concern of upper abdominal discomfort, hypertension. Patient has notable family history of his father dying from MI at approximately age 45, after similar episode of upper abdominal pain.  Patient does not smoke, states that he is generally well. He has recently completed a course of phentermine therapy for weight loss, and he is on a biweekly regimen of testosterone injection. Yesterday, approximately 12 hours ago the patient developed epigastric and left upper quadrant abdominal pain after ingesting pizza, and went to a football game. After the pain persisted for several hours he became concerned, and presents for evaluation.  He went to our affiliated facility, left, and presents here for evaluation. He notes that over the past few weeks, though he has been feeling generally well, has been losing weight, he has noticed increased blood pressure including systolic readings in the 180 range  History reviewed. No pertinent past medical history.  Patient Active Problem List   Diagnosis Date Noted  . Right knee injury 04/13/2013    History reviewed. No pertinent surgical history.     Home Medications    Prior to Admission medications   Medication Sig Start Date End Date Taking? Authorizing Provider  ALPRAZolam Prudy Feeler(XANAX) 0.25 MG tablet Take 0.25 mg by mouth at bedtime as needed for anxiety.    [provider]  diphenoxylate-atropine (LOMOTIL) 2.5-0.025 MG per tablet Take by mouth. 08/05/13   [provider]  fluticasone (FLONASE) 50 MCG/ACT nasal spray 1 spray by Each Nare route daily.    [provider]  ibuprofen (ADVIL,MOTRIN) 800 MG tablet Take 800 mg by mouth  every 8 (eight) hours as needed.    [provider]  loratadine (CLARITIN) 10 MG tablet Take 10 mg by mouth daily.    [provider]    Family History Family History  Problem Relation Age of Onset  . Heart attack Father   . Sudden death Father   . Hypertension Mother   . Diabetes Neg Hx   . Hyperlipidemia Neg Hx     Social History Social History   Tobacco Use  . Smoking status: Former Smoker    Last attempt to quit: 02/06/2003    Years since quitting: 13.8  . Smokeless tobacco: Never Used  Substance Use Topics  . Alcohol use: No  . Drug use: Not on file     Allergies   Shellfish allergy   Review of Systems Review of Systems  Constitutional:       Per HPI, otherwise negative  HENT:       Per HPI, otherwise negative  Respiratory:       Per HPI, otherwise negative  Cardiovascular:       Per HPI, otherwise negative  Gastrointestinal: Negative for vomiting.  Endocrine:       Negative aside from HPI  Genitourinary:       Neg aside from HPI   Musculoskeletal:       Per HPI, otherwise negative  Skin: Negative.   Neurological: Negative for syncope.     Physical Exam Updated Vital Signs BP (!) 148/108   Pulse 76   Temp 97.8 F (36.6 C) (Oral)   Resp  15   SpO2 98%   Physical Exam  Constitutional: He is oriented to person, place, and time. He appears well-developed. No distress.  HENT:  Head: Normocephalic and atraumatic.  Eyes: Conjunctivae and EOM are normal.  Cardiovascular: Normal rate and regular rhythm.  Pulmonary/Chest: Effort normal. No stridor. No respiratory distress.  Abdominal: He exhibits no distension.  Minimal tenderness to palpation in the epigastrium and the left upper quadrant.  Musculoskeletal: He exhibits no edema.  Neurological: He is alert and oriented to person, place, and time.  Skin: Skin is warm and dry.  Psychiatric: He has a normal mood and affect.  Nursing note and vitals reviewed.    ED Treatments /  Results  Labs (all labs ordered are listed, but only abnormal results are displayed) Labs Reviewed  CBC - Abnormal; Notable for the following components:      Result Value   RBC 6.01 (*)    Hemoglobin 17.8 (*)    All other components within normal limits  URINALYSIS, ROUTINE W REFLEX MICROSCOPIC - Abnormal; Notable for the following components:   Color, Urine STRAW (*)    Specific Gravity, Urine 1.004 (*)    All other components within normal limits  BASIC METABOLIC PANEL  LIPASE, BLOOD  I-STAT TROPONIN, ED  I-STAT TROPONIN, ED    EKG  EKG Interpretation  Date/Time:  Saturday December 22 2016 02:22:10 EST Ventricular Rate:  92 PR Interval:  162 QRS Duration: 98 QT Interval:  348 QTC Calculation: 430 R Axis:   -3 Text Interpretation:  Normal sinus rhythm Artifact Otherwise within normal limits Confirmed by Gerhard MunchLockwood, Harris Penton [phone]) on 12/22/2016 8:24:46 AM       Radiology Dg Chest 2 View  Result Date: 12/22/2016 CLINICAL DATA:  Acute onset of shortness of breath. Generalized chest and abdominal pain. High blood pressure. EXAM: CHEST  2 VIEW COMPARISON:  Chest radiograph performed 06/03/2014 FINDINGS: The lungs are well-aerated and clear. There is no evidence of focal opacification, pleural effusion or pneumothorax. The heart is normal in size; the mediastinal contour is within normal limits. No acute osseous abnormalities are seen. IMPRESSION: No acute cardiopulmonary process seen. Electronically Signed   By: Roanna RaiderJeffery  Chang M.D.   On: 12/22/2016 03:07    Procedures Procedures (including critical care time)  Medications Ordered in ED Medications  hydrochlorothiazide (HYDRODIURIL) tablet 25 mg (25 mg Oral Given 12/22/16 0821)  gi cocktail (Maalox,Lidocaine,Donnatal) (30 mLs Oral Given 12/22/16 [id number])     Initial Impression / Assessment and Plan / ED Course  I have reviewed the triage vital signs and the nursing notes.  Pertinent labs & imaging results that were  available during my care of the patient were reviewed by me and considered in my medical decision making (see chart for details).    On repeat exam patient is in no distress, awake, alert. 2 troponins are negative, he has no ongoing chest pain, does have mild ongoing epigastric discomfort, given his endorsement of pizza intake yesterday, this is likely due to gastric etiology. With otherwise reassuring findings, patient was started on an antihypertensive regimen, though there is consideration of this being a resident of his phentermine therapy. Patient was encouraged to follow-up with primary care, consider cessation of the medication of his blood pressure is appropriately controlled, and it is no longer necessary. Absent other complaints, with reassuring findings, unremarkable vital signs beyond mild hypertension, the patient was discharged with close outpatient follow-up.  Final Clinical Impressions(s) / ED Diagnoses  Epigastric pain Hypertensive urgency  Gerhard Munch, MD 12/22/16 [phone]

## 2016-12-22 NOTE — ED Triage Notes (Addendum)
PT presents with c/o hypertension tonight, abdominal pain, and headache. PT states he went to a football game and ate pizza and when he got home he started having abdominal pain. PT states he was taking phentermine 2 weeks ago. Pt reports BP at home 180's/120's..Marland Kitchen

## 2016-12-22 NOTE — Discharge Instructions (Signed)
As discussed, your evaluation today has been largely reassuring.  But, it is important that you monitor your condition carefully, and do not hesitate to return to the ED if you develop new, or concerning changes in your condition. ? ?Otherwise, please follow-up with your physician for appropriate ongoing care. ? ?
